# Patient Record
Sex: Female | Born: 1980 | Race: White | Hispanic: No | Smoking: Current every day smoker
Health system: Southern US, Community
[De-identification: ages and names within clinical notes are randomized; demographics above are authoritative.]

## PROBLEM LIST (undated history)

## (undated) DIAGNOSIS — Z1371 Encounter for nonprocreative screening for genetic disease carrier status: Secondary | ICD-10-CM

## (undated) DIAGNOSIS — F329 Major depressive disorder, single episode, unspecified: Secondary | ICD-10-CM

## (undated) DIAGNOSIS — F32A Depression, unspecified: Secondary | ICD-10-CM

## (undated) DIAGNOSIS — Z803 Family history of malignant neoplasm of breast: Secondary | ICD-10-CM

## (undated) DIAGNOSIS — F319 Bipolar disorder, unspecified: Secondary | ICD-10-CM

## (undated) DIAGNOSIS — Z8481 Family history of carrier of genetic disease: Secondary | ICD-10-CM

## (undated) DIAGNOSIS — J45909 Unspecified asthma, uncomplicated: Secondary | ICD-10-CM

## (undated) DIAGNOSIS — F419 Anxiety disorder, unspecified: Secondary | ICD-10-CM

## (undated) HISTORY — PX: TONSILLECTOMY: SUR1361

## (undated) HISTORY — PX: CHOLECYSTECTOMY: SHX55

## (undated) HISTORY — DX: Anxiety disorder, unspecified: F41.9

## (undated) HISTORY — PX: ADENOIDECTOMY: SUR15

## (undated) HISTORY — DX: Family history of malignant neoplasm of breast: Z80.3

## (undated) HISTORY — PX: IUD REMOVAL: SHX5392

## (undated) HISTORY — DX: Family history of carrier of genetic disease: Z84.81

## (undated) HISTORY — DX: Bipolar disorder, unspecified: F31.9

## (undated) HISTORY — DX: Depression, unspecified: F32.A

## (undated) HISTORY — DX: Major depressive disorder, single episode, unspecified: F32.9

## (undated) HISTORY — PX: HERNIA REPAIR: SHX51

---
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
## 2011-02-19 ENCOUNTER — Observation Stay: Payer: Self-pay | Admitting: Internal Medicine

## 2011-02-20 ENCOUNTER — Inpatient Hospital Stay: Payer: Self-pay | Admitting: Psychiatry

## 2011-08-14 ENCOUNTER — Emergency Department: Payer: Self-pay | Admitting: Emergency Medicine

## 2011-12-18 ENCOUNTER — Emergency Department (HOSPITAL_COMMUNITY)
Admission: EM | Admit: 2011-12-18 | Discharge: 2011-12-18 | Disposition: A | Discharge status: Home / Self Care | Payer: Medicaid Other | Attending: Emergency Medicine | Admitting: Emergency Medicine

## 2011-12-18 ENCOUNTER — Encounter (HOSPITAL_COMMUNITY): Payer: Self-pay | Admitting: Emergency Medicine

## 2011-12-18 DIAGNOSIS — J45909 Unspecified asthma, uncomplicated: Secondary | ICD-10-CM | POA: Insufficient documentation

## 2011-12-18 DIAGNOSIS — K429 Umbilical hernia without obstruction or gangrene: Secondary | ICD-10-CM | POA: Insufficient documentation

## 2011-12-18 DIAGNOSIS — F17200 Nicotine dependence, unspecified, uncomplicated: Secondary | ICD-10-CM | POA: Insufficient documentation

## 2011-12-18 HISTORY — DX: Unspecified asthma, uncomplicated: J45.909

## 2011-12-18 LAB — URINALYSIS, ROUTINE W REFLEX MICROSCOPIC
Bilirubin Urine: NEGATIVE
Glucose, UA: NEGATIVE mg/dL
Ketones, ur: NEGATIVE mg/dL
Protein, ur: NEGATIVE mg/dL

## 2011-12-18 LAB — CBC WITH DIFFERENTIAL/PLATELET
Basophils Absolute: 0 10*3/uL (ref 0.0–0.1)
Eosinophils Relative: 3 % (ref 0–5)
HCT: 39.2 % (ref 36.0–46.0)
Lymphocytes Relative: 39 % (ref 12–46)
MCV: 87.7 fL (ref 78.0–100.0)
Monocytes Absolute: 0.7 10*3/uL (ref 0.1–1.0)
RDW: 13.5 % (ref 11.5–15.5)
WBC: 10 10*3/uL (ref 4.0–10.5)

## 2011-12-18 LAB — BASIC METABOLIC PANEL
BUN: 10 mg/dL (ref 6–23)
CO2: 29 mEq/L (ref 19–32)
Calcium: 9.2 mg/dL (ref 8.4–10.5)
Creatinine, Ser: 0.69 mg/dL (ref 0.50–1.10)
Glucose, Bld: 86 mg/dL (ref 70–99)

## 2011-12-18 MED ORDER — ONDANSETRON HCL 4 MG/2ML IJ SOLN
4.0000 mg | Freq: Once | INTRAMUSCULAR | Status: AC
  Administered 2011-12-18: 4 mg via INTRAVENOUS
  Filled 2011-12-18: qty 2

## 2011-12-18 MED ORDER — HYDROMORPHONE HCL PF 1 MG/ML IJ SOLN
1.0000 mg | Freq: Once | INTRAMUSCULAR | Status: AC
  Administered 2011-12-18: 1 mg via INTRAVENOUS
  Filled 2011-12-18: qty 1

## 2011-12-18 NOTE — ED Notes (Signed)
The patient is AOx4 and comfortable with her discharge instructions.  The patient's ride home is present. 

## 2011-12-18 NOTE — ED Provider Notes (Signed)
History     CSN: [id number]  Arrival date & time 12/18/11  1545   First MD Initiated Contact with Patient 12/18/11 2114      Chief Complaint  Patient presents with  . Abdominal Pain    (Consider location/radiation/quality/duration/timing/severity/associated sxs/prior treatment) HPI Pt reports several days of pain in her umbilical region gradually worsening and associated with a tender lump some occasional vomiting, no change in bowels and no fever. She has a history of umbilical hernia repaired with a stitch during removal of intraabdominal IUD. She has been coughing lately due to a bronchitis.    Past Medical History  Diagnosis Date  . Asthma     Past Surgical History  Procedure Date  . Hernia repair   . Iud removal   . Cholecystectomy   . Tonsillectomy   . Adenoidectomy     No family history on file.  History  Substance Use Topics  . Smoking status: Current Every Day Smoker  . Smokeless tobacco: Not on file  . Alcohol Use: No    OB History    Grav Para Term Preterm Abortions TAB SAB Ect Mult Living                  Review of Systems All other systems reviewed and are negative except as noted in HPI.   Allergies  Aspirin; Lodine; and Toradol  Home Medications   Current Outpatient Rx  Name Route Sig Dispense Refill  . ALBUTEROL SULFATE HFA 108 (90 BASE) MCG/ACT IN AERS Inhalation Inhale 2 puffs into the lungs every 6 (six) hours as needed. For shortness of breath    . DOXYCYCLINE HYCLATE 100 MG PO TABS Oral Take 100 mg by mouth 2 (two) times daily. Take for 10 days starting 12/13/11    . HYDROCODONE-HOMATROPINE 5-1.5 MG/5ML PO SYRP Oral Take 5 mLs by mouth every 6 (six) hours as needed. For cough      BP 102/63  Pulse 77  Temp 98.3 F (36.8 C) (Oral)  Resp 16  Ht 5' (1.524 m)  Wt 155 lb (70.308 kg)  BMI 30.27 kg/m2  SpO2 100%  LMP 12/18/2011  Physical Exam  Nursing note and vitals reviewed. Constitutional: She is oriented to person, place,  and time. She appears well-developed and well-nourished.  HENT:  Head: Normocephalic and atraumatic.  Eyes: EOM are normal. Pupils are equal, round, and reactive to light.  Neck: Normal range of motion. Neck supple.  Cardiovascular: Normal rate, normal heart sounds and intact distal pulses.   Pulmonary/Chest: Effort normal and breath sounds normal.  Abdominal: Bowel sounds are normal. She exhibits mass (umbilical herna). She exhibits no distension. There is tenderness (moderate tender umbilical hernia). There is no rebound and no guarding.  Musculoskeletal: Normal range of motion. She exhibits no edema and no tenderness.  Neurological: She is alert and oriented to person, place, and time. She has normal strength. No cranial nerve deficit or sensory deficit.  Skin: Skin is warm and dry. No rash noted.  Psychiatric: She has a normal mood and affect.    ED Course  Procedures (including critical care time)  Labs Reviewed  URINALYSIS, ROUTINE W REFLEX MICROSCOPIC - Abnormal; Notable for the following:    Hgb urine dipstick SMALL (*)     All other components within normal limits  CBC WITH DIFFERENTIAL  URINE MICROSCOPIC-ADD ON  BASIC METABOLIC PANEL  PREGNANCY, URINE   No results found.   No diagnosis found.    MDM  Pt  given pain medications, placed in trendelenburg and hernia was reduced. Pt tolerated well. Advised to hold pressure on the area for coughing or sneezing. Awaiting labs but anticipate discharge with Gen Surg followup.         Charles B. Bernette Mayers, MD 12/19/11 [phone]

## 2011-12-18 NOTE — ED Notes (Signed)
Pt c/o abdominal pain at umbilical area. Pt reports history of hernia and pain feels same. Pt c/o nausea and vomiting yesterday.

## 2012-11-16 ENCOUNTER — Emergency Department: Payer: Self-pay | Admitting: Emergency Medicine

## 2012-11-16 LAB — CBC WITH DIFFERENTIAL/PLATELET
Eosinophil #: 0.1 10*3/uL (ref 0.0–0.7)
HCT: 39.5 % (ref 35.0–47.0)
HGB: 13.7 g/dL (ref 12.0–16.0)
Lymphocyte #: 2.5 10*3/uL (ref 1.0–3.6)
Lymphocyte %: 23.9 %
MCH: 30.2 pg (ref 26.0–34.0)
MCHC: 34.5 g/dL (ref 32.0–36.0)
MCV: 87 fL (ref 80–100)
Monocyte %: 4.9 %
Neutrophil #: 7.3 10*3/uL — ABNORMAL HIGH (ref 1.4–6.5)
Neutrophil %: 69.7 %
RBC: 4.52 10*6/uL (ref 3.80–5.20)
RDW: 13.4 % (ref 11.5–14.5)
WBC: 10.5 10*3/uL (ref 3.6–11.0)

## 2012-11-16 LAB — BASIC METABOLIC PANEL
BUN: 9 mg/dL (ref 7–18)
Calcium, Total: 9.2 mg/dL (ref 8.5–10.1)
Chloride: 106 mmol/L (ref 98–107)
Co2: 26 mmol/L (ref 21–32)
Creatinine: 0.8 mg/dL (ref 0.60–1.30)
EGFR (Non-African Amer.): >60
Osmolality: 276 (ref 275–301)
Sodium: 139 mmol/L (ref 136–145)

## 2013-01-16 ENCOUNTER — Ambulatory Visit: Payer: Self-pay | Admitting: Obstetrics and Gynecology

## 2013-06-21 ENCOUNTER — Encounter: Payer: Self-pay | Admitting: Anesthesiology

## 2013-07-20 ENCOUNTER — Encounter: Payer: Self-pay | Admitting: Anesthesiology

## 2013-09-03 ENCOUNTER — Ambulatory Visit: Payer: Self-pay | Admitting: Specialist

## 2015-09-11 ENCOUNTER — Encounter: Payer: Self-pay | Admitting: *Deleted

## 2017-01-16 ENCOUNTER — Other Ambulatory Visit: Payer: Self-pay | Admitting: Advanced Practice Midwife

## 2017-01-31 ENCOUNTER — Telehealth: Payer: Self-pay

## 2017-01-31 MED ORDER — NORETHINDRONE ACETATE 5 MG PO TABS: 5.0000 mg | ORAL_TABLET | Freq: Every day | ORAL | 0 refills | Status: DC

## 2017-01-31 NOTE — Telephone Encounter (Signed)
Pt called for refill of norethindrone, has appt Fri.  Pt aware refill eRx'd.

## 2017-02-02 NOTE — Telephone Encounter (Signed)
Pt called today stating that rx was sent in incorrectly. Her family planning medicaid does not cover this med and she has to pay out of pocket and directions needed to be take 1/2 tablet (2.5 mg) daily. Called [facility] in [place] to update rx.

## 2017-02-04 ENCOUNTER — Ambulatory Visit: Payer: Self-pay | Admitting: Advanced Practice Midwife

## 2017-02-17 ENCOUNTER — Encounter: Payer: Self-pay | Admitting: Advanced Practice Midwife

## 2017-02-17 ENCOUNTER — Ambulatory Visit (INDEPENDENT_AMBULATORY_CARE_PROVIDER_SITE_OTHER): Payer: Medicaid Other | Admitting: Advanced Practice Midwife

## 2017-02-17 VITALS — BP 124/78 | Ht 64.0 in | Wt 242.0 lb

## 2017-02-17 DIAGNOSIS — J302 Other seasonal allergic rhinitis: Secondary | ICD-10-CM | POA: Insufficient documentation

## 2017-02-17 DIAGNOSIS — Z124 Encounter for screening for malignant neoplasm of cervix: Secondary | ICD-10-CM | POA: Diagnosis not present

## 2017-02-17 DIAGNOSIS — F329 Major depressive disorder, single episode, unspecified: Secondary | ICD-10-CM | POA: Insufficient documentation

## 2017-02-17 DIAGNOSIS — F319 Bipolar disorder, unspecified: Secondary | ICD-10-CM | POA: Insufficient documentation

## 2017-02-17 DIAGNOSIS — Z309 Encounter for contraceptive management, unspecified: Secondary | ICD-10-CM

## 2017-02-17 DIAGNOSIS — G43909 Migraine, unspecified, not intractable, without status migrainosus: Secondary | ICD-10-CM | POA: Insufficient documentation

## 2017-02-17 DIAGNOSIS — Z720 Tobacco use: Secondary | ICD-10-CM | POA: Insufficient documentation

## 2017-02-17 DIAGNOSIS — N939 Abnormal uterine and vaginal bleeding, unspecified: Secondary | ICD-10-CM | POA: Diagnosis not present

## 2017-02-17 DIAGNOSIS — Z01419 Encounter for gynecological examination (general) (routine) without abnormal findings: Secondary | ICD-10-CM

## 2017-02-17 MED ORDER — NORETHINDRONE ACETATE 5 MG PO TABS: 5.0000 mg | ORAL_TABLET | Freq: Every day | ORAL | 12 refills | Status: DC

## 2017-02-17 NOTE — Progress Notes (Addendum)
Patient ID: CARLIS BURNSWORTH, female   DOB: Oct 27, 1980, 36 y.o.   MRN: [id number]     Gynecology Annual Exam  PCP: System, Provider Not In  Chief Complaint:  Chief Complaint  Patient presents with  . Annual Exam    History of Present Illness: Patient is a 36 y.o. [id number] presents for annual exam. The patient has complaint today of itchy spot on left breast. She has a small hemangioma at 3:00 that she has noticed for 10 years. The itching has been there for 1 year. There are no other skin changes on the breast. We also had a discussion of smoking cessation and healthy lifestyle. She uses Norethindrone 5 mg to control her heavy, painful periods.   LMP: Patient's last menstrual period was 10/17/2016. She ran out of pills in July and had breakthrough bleeding then but has not had any bleeding since then.  Postcoital Bleeding: no Dysmenorrhea: not applicable  The patient is sexually active. She currently uses vasectomy for contraception. She denies dyspareunia.  The patient does perform self breast exams.  There is notable family history of breast or ovarian cancer in her family. Her paternal aunt had breast cancer at less than 15 years old. She tested positive for BRCA gene. The patient declines genetic screening.   The patient wears seatbelts: yes.   The patient has regular exercise: no.  She does drink sugar sweetened beverages. She admits to being ready to quit smoking and she has gone from 2 PPD to less than a PPD. She knows that smoking is a risk factor for cancer and other illnesses. She has a 25 year smoking history.  The patient denies current symptoms of depression.  She takes Seroquel to control her mood symptoms.  Review of Systems: Review of Systems  Constitutional: Negative.   HENT: Negative.   Eyes: Negative.   Respiratory: Negative.   Cardiovascular: Negative.   Gastrointestinal: Negative.   Genitourinary: Negative.   Musculoskeletal: Negative.   Skin: Positive for  itching.       On left breast at 3:00  Neurological: Negative.   Endo/Heme/Allergies: Negative.   Psychiatric/Behavioral: Negative.     Past Medical History:  Past Medical History:  Diagnosis Date  . Anxiety   . Asthma   . Bipolar 1 disorder ([place])   . Depressive disorder     Past Surgical History:  Past Surgical History:  Procedure Laterality Date  . ADENOIDECTOMY    . CHOLECYSTECTOMY    . HERNIA REPAIR    . IUD REMOVAL    . TONSILLECTOMY      Gynecologic History:  Patient's last menstrual period was 10/17/2016. Contraception: vasectomy Last Pap: 2 years ago Results were: no abnormalities   Obstetric History: [id number]  Family History:  Family History  Problem Relation Age of Onset  . Breast cancer Maternal Grandmother >50        Breast cancer                                           Paternal Aunt                       <50  Social History:  Social History   Socioeconomic History  . Marital status: Married    Spouse name: Not on file  . Number of children: Not on file  . Years of education:  Not on file  . Highest education level: Not on file  Social Needs  . Financial resource strain: Not on file  . Food insecurity - worry: Not on file  . Food insecurity - inability: Not on file  . Transportation needs - medical: Not on file  . Transportation needs - non-medical: Not on file  Occupational History  . Not on file  Tobacco Use  . Smoking status: Current Every Day Smoker  . Smokeless tobacco: Never Used  Substance and Sexual Activity  . Alcohol use: No  . Drug use: No  . Sexual activity: Yes    Birth control/protection: Pill  Other Topics Concern  . Not on file  Social History Narrative  . Not on file    Allergies:  Allergies  Allergen Reactions  . Morphine Anaphylaxis  . Aspirin     Unknown  . Lodine [Etodolac]     Unknown  . Toradol [Ketorolac Tromethamine]     Unknown    Medications: Prior to Admission medications   Medication Sig Start  Date End Date Taking? Authorizing Provider  esomeprazole (NEXIUM) 20 MG packet Take 20 mg by mouth daily before breakfast.   Yes [provider]  loratadine (CLARITIN) 10 MG tablet TK 1 T PO QD 01/20/16  Yes [provider]  norethindrone (AYGESTIN) 5 MG tablet Take 1 tablet (5 mg total) daily by mouth. 01/31/17  Yes Rod Can, CNM  QUEtiapine (SEROQUEL XR) 400 MG 24 hr tablet TK 1 T PO QPM WF 01/22/16  Yes [provider]  albuterol (PROVENTIL HFA;VENTOLIN HFA) 108 (90 BASE) MCG/ACT inhaler Inhale 2 puffs into the lungs every 6 (six) hours as needed. For shortness of breath    [provider]    Physical Exam Vitals: Blood pressure 124/78, height _0  (1.626 m), weight 242 lb (109.8 kg), last menstrual period 10/17/2016.  General: NAD HEENT: normocephalic, anicteric Thyroid: no enlargement, no palpable nodules Pulmonary: No increased work of breathing, CTAB Cardiovascular: RRR, distal pulses 2+ Breast: Breast symmetrical, no tenderness, no palpable nodules or masses, no skin or nipple retraction present, no nipple discharge.  No axillary or supraclavicular lymphadenopathy. Small hemangioma at 3:00. Abdomen: NABS, soft, non-tender, non-distended.  Umbilicus without lesions.  No hepatomegaly, splenomegaly or masses palpable. No evidence of hernia  Genitourinary:  External: Normal external female genitalia.  Normal urethral meatus, normal  Bartholin's and Skene's glands.    Vagina: Normal vaginal mucosa, no evidence of prolapse.    Cervix: Grossly normal in appearance, no bleeding, no CMT  Uterus: Non-enlarged, mobile, normal contour.    Adnexa: ovaries non-enlarged, no adnexal masses  Rectal: deferred  Lymphatic: no evidence of inguinal lymphadenopathy Extremities: no edema, erythema, or tenderness Neurologic: Grossly intact Psychiatric: mood appropriate, affect full   Assessment: 36 y.o. [id number] routine annual exam  Plan: Problem List Items  Addressed This Visit    None    Visit Diagnoses    Well woman exam with routine gynecological exam    -  Primary   Relevant Orders   IGP, Aptima HPV   Cervical cancer screening       Relevant Orders   IGP, Aptima HPV   Abnormal uterine bleeding       Relevant Medications   norethindrone (AYGESTIN) 5 MG tablet      1) STI screening was offered and declined  2) ASCCP guidelines and rational discussed.  Patient opts for yearly screening interval  3) Contraception - Vasectomy  4) Routine healthcare maintenance  including cholesterol, diabetes screening discussed Declines  5) Follow up 1 year for routine annual exam  Rod Can, CNM

## 2017-02-17 NOTE — Patient Instructions (Addendum)
Health Maintenance, Female Adopting a healthy lifestyle and getting preventive care can go a long way to promote health and wellness. Talk with your health care provider about what schedule of regular examinations is right for you. This is a good chance for you to check in with your provider about disease prevention and staying healthy. In between checkups, there are plenty of things you can do on your own. Experts have done a lot of research about which lifestyle changes and preventive measures are most likely to keep you healthy. Ask your health care provider for more information. Weight and diet Eat a healthy diet  Be sure to include plenty of vegetables, fruits, low-fat dairy products, and lean protein.  Do not eat a lot of foods high in solid fats, added sugars, or salt.  Get regular exercise. This is one of the most important things you can do for your health. ? Most adults should exercise for at least 150 minutes each week. The exercise should increase your heart rate and make you sweat (moderate-intensity exercise). ? Most adults should also do strengthening exercises at least twice a week. This is in addition to the moderate-intensity exercise.  Maintain a healthy weight  Body mass index (BMI) is a measurement that can be used to identify possible weight problems. It estimates body fat based on height and weight. Your health care provider can help determine your BMI and help you achieve or maintain a healthy weight.  For females 69 years of age and older: ? A BMI below 18.5 is considered underweight. ? A BMI of 18.5 to 24.9 is normal. ? A BMI of 25 to 29.9 is considered overweight. ? A BMI of 30 and above is considered obese.  Watch levels of cholesterol and blood lipids  You should start having your blood tested for lipids and cholesterol at 37 years of age, then have this test every 5 years.  You may need to have your cholesterol levels checked more often if: ? Your lipid or  cholesterol levels are high. ? You are older than 37 years of age. ? You are at high risk for heart disease.  Cancer screening Lung Cancer  Lung cancer screening is recommended for adults 70-27 years old who are at high risk for lung cancer because of a history of smoking.  A yearly low-dose CT scan of the lungs is recommended for people who: ? Currently smoke. ? Have quit within the past 15 years. ? Have at least a 30-pack-year history of smoking. A pack year is smoking an average of one pack of cigarettes a day for 1 year.  Yearly screening should continue until it has been 15 years since you quit.  Yearly screening should stop if you develop a health problem that would prevent you from having lung cancer treatment.  Breast Cancer  Practice breast self-awareness. This means understanding how your breasts normally appear and feel.  It also means doing regular breast self-exams. Let your health care provider know about any changes, no matter how small.  If you are in your 20s or 30s, you should have a clinical breast exam (CBE) by a health care provider every 1-3 years as part of a regular health exam.  If you are 68 or older, have a CBE every year. Also consider having a breast X-ray (mammogram) every year.  If you have a family history of breast cancer, talk to your health care provider about genetic screening.  If you are at high risk  for breast cancer, talk to your health care provider about having an MRI and a mammogram every year.  Breast cancer gene (BRCA) assessment is recommended for women who have family members with BRCA-related cancers. BRCA-related cancers include: ? Breast. ? Ovarian. ? Tubal. ? Peritoneal cancers.  Results of the assessment will determine the need for genetic counseling and BRCA1 and BRCA2 testing.  Cervical Cancer Your health care provider may recommend that you be screened regularly for cancer of the pelvic organs (ovaries, uterus, and  vagina). This screening involves a pelvic examination, including checking for microscopic changes to the surface of your cervix (Pap test). You may be encouraged to have this screening done every 3 years, beginning at age 22.  For women ages 56-65, health care providers may recommend pelvic exams and Pap testing every 3 years, or they may recommend the Pap and pelvic exam, combined with testing for human papilloma virus (HPV), every 5 years. Some types of HPV increase your risk of cervical cancer. Testing for HPV may also be done on women of any age with unclear Pap test results.  Other health care providers may not recommend any screening for nonpregnant women who are considered low risk for pelvic cancer and who do not have symptoms. Ask your health care provider if a screening pelvic exam is right for you.  If you have had past treatment for cervical cancer or a condition that could lead to cancer, you need Pap tests and screening for cancer for at least 20 years after your treatment. If Pap tests have been discontinued, your risk factors (such as having a new sexual partner) need to be reassessed to determine if screening should resume. Some women have medical problems that increase the chance of getting cervical cancer. In these cases, your health care provider may recommend more frequent screening and Pap tests.  Colorectal Cancer  This type of cancer can be detected and often prevented.  Routine colorectal cancer screening usually begins at 37 years of age and continues through 37 years of age.  Your health care provider may recommend screening at an earlier age if you have risk factors for colon cancer.  Your health care provider may also recommend using home test kits to check for hidden blood in the stool.  A small camera at the end of a tube can be used to examine your colon directly (sigmoidoscopy or colonoscopy). This is done to check for the earliest forms of colorectal  cancer.  Routine screening usually begins at age 33.  Direct examination of the colon should be repeated every 5-10 years through 37 years of age. However, you may need to be screened more often if early forms of precancerous polyps or small growths are found.  Skin Cancer  Check your skin from head to toe regularly.  Tell your health care provider about any new moles or changes in moles, especially if there is a change in a mole's shape or color.  Also tell your health care provider if you have a mole that is larger than the size of a pencil eraser.  Always use sunscreen. Apply sunscreen liberally and repeatedly throughout the day.  Protect yourself by wearing long sleeves, pants, a wide-brimmed hat, and sunglasses whenever you are outside.  Heart disease, diabetes, and high blood pressure  High blood pressure causes heart disease and increases the risk of stroke. High blood pressure is more likely to develop in: ? People who have blood pressure in the high end of  the normal range (130-139/85-89 mm Hg). ? People who are overweight or obese. ? People who are African American.  If you are 21-29 years of age, have your blood pressure checked every 3-5 years. If you are 3 years of age or older, have your blood pressure checked every year. You should have your blood pressure measured twice-once when you are at a hospital or clinic, and once when you are not at a hospital or clinic. Record the average of the two measurements. To check your blood pressure when you are not at a hospital or clinic, you can use: ? An automated blood pressure machine at a pharmacy. ? A home blood pressure monitor.  If you are between 17 years and 37 years old, ask your health care provider if you should take aspirin to prevent strokes.  Have regular diabetes screenings. This involves taking a blood sample to check your fasting blood sugar level. ? If you are at a normal weight and have a low risk for diabetes,  have this test once every three years after 37 years of age. ? If you are overweight and have a high risk for diabetes, consider being tested at a younger age or more often. Preventing infection Hepatitis B  If you have a higher risk for hepatitis B, you should be screened for this virus. You are considered at high risk for hepatitis B if: ? You were born in a country where hepatitis B is common. Ask your health care provider which countries are considered high risk. ? Your parents were born in a high-risk country, and you have not been immunized against hepatitis B (hepatitis B vaccine). ? You have HIV or AIDS. ? You use needles to inject street drugs. ? You live with someone who has hepatitis B. ? You have had sex with someone who has hepatitis B. ? You get hemodialysis treatment. ? You take certain medicines for conditions, including cancer, organ transplantation, and autoimmune conditions.  Hepatitis C  Blood testing is recommended for: ? Everyone born from [birth year] through 1966. ? Anyone with known risk factors for hepatitis C.  Sexually transmitted infections (STIs)  You should be screened for sexually transmitted infections (STIs) including gonorrhea and chlamydia if: ? You are sexually active and are younger than 37 years of age. ? You are older than 36 years of age and your health care provider tells you that you are at risk for this type of infection. ? Your sexual activity has changed since you were last screened and you are at an increased risk for chlamydia or gonorrhea. Ask your health care provider if you are at risk.  If you do not have HIV, but are at risk, it may be recommended that you take a prescription medicine daily to prevent HIV infection. This is called pre-exposure prophylaxis (PrEP). You are considered at risk if: ? You are sexually active and do not regularly use condoms or know the HIV status of your partner(s). ? You take drugs by injection. ? You are  sexually active with a partner who has HIV.  Talk with your health care provider about whether you are at high risk of being infected with HIV. If you choose to begin PrEP, you should first be tested for HIV. You should then be tested every 3 months for as long as you are taking PrEP. Pregnancy  If you are premenopausal and you may become pregnant, ask your health care provider about preconception counseling.  If you may become  pregnant, take 400 to 800 micrograms (mcg) of folic acid every day.  If you want to prevent pregnancy, talk to your health care provider about birth control (contraception). Osteoporosis and menopause  Osteoporosis is a disease in which the bones lose minerals and strength with aging. This can result in serious bone fractures. Your risk for osteoporosis can be identified using a bone density scan.  If you are 52 years of age or older, or if you are at risk for osteoporosis and fractures, ask your health care provider if you should be screened.  Ask your health care provider whether you should take a calcium or vitamin D supplement to lower your risk for osteoporosis.  Menopause may have certain physical symptoms and risks.  Hormone replacement therapy may reduce some of these symptoms and risks. Talk to your health care provider about whether hormone replacement therapy is right for you. Follow these instructions at home:  Schedule regular health, dental, and eye exams.  Stay current with your immunizations.  Do not use any tobacco products including cigarettes, chewing tobacco, or electronic cigarettes.  If you are pregnant, do not drink alcohol.  If you are breastfeeding, limit how much and how often you drink alcohol.  Limit alcohol intake to no more than 1 drink per day for nonpregnant women. One drink equals 12 ounces of beer, 5 ounces of wine, or 1 ounces of hard liquor.  Do not use street drugs.  Do not share needles.  Ask your health care  provider for help if you need support or information about quitting drugs.  Tell your health care provider if you often feel depressed.  Tell your health care provider if you have ever been abused or do not feel safe at home. This information is not intended to replace advice given to you by your health care provider. Make sure you discuss any questions you have with your health care provider. Document Released: 09/21/2010 Document Revised: 08/14/2015 Document Reviewed: 12/10/2014 Elsevier Interactive Patient Education  2018 [employer].     Why follow it? Research shows. . Those who follow the Mediterranean diet have a reduced risk of heart disease  . The diet is associated with a reduced incidence of Parkinson's and Alzheimer's diseases . People following the diet may have longer life expectancies and lower rates of chronic diseases  . The Dietary Guidelines for Americans recommends the Mediterranean diet as an eating plan to promote health and prevent disease  What Is the Mediterranean Diet?  . Healthy eating plan based on typical foods and recipes of Mediterranean-style cooking . The diet is primarily a plant based diet; these foods should make up a majority of meals   Starches - Plant based foods should make up a majority of meals - They are an important sources of vitamins, minerals, energy, antioxidants, and fiber - Choose whole grains, foods high in fiber and minimally processed items  - Typical grain sources include wheat, oats, barley, corn, brown rice, bulgar, farro, millet, polenta, couscous  - Various types of beans include chickpeas, lentils, fava beans, black beans, white beans   Fruits  Veggies - Large quantities of antioxidant rich fruits & veggies; 6 or more servings  - Vegetables can be eaten raw or lightly drizzled with oil and cooked  - Vegetables common to the traditional Mediterranean Diet include: artichokes, arugula, beets, broccoli, brussel sprouts, cabbage,  carrots, celery, collard greens, cucumbers, eggplant, kale, leeks, lemons, lettuce, mushrooms, okra, onions, peas, peppers, potatoes, pumpkin, radishes, rutabaga,  shallots, spinach, sweet potatoes, turnips, zucchini - Fruits common to the Mediterranean Diet include: apples, apricots, avocados, cherries, clementines, dates, figs, grapefruits, grapes, melons, nectarines, oranges, peaches, pears, pomegranates, strawberries, tangerines  Fats - Replace butter and margarine with healthy oils, such as olive oil, canola oil, and tahini  - Limit nuts to no more than a handful a day  - Nuts include walnuts, almonds, pecans, pistachios, pine nuts  - Limit or avoid candied, honey roasted or heavily salted nuts - Olives are central to the [employer] - can be eaten whole or used in a variety of dishes   Meats Protein - Limiting red meat: no more than a few times a month - When eating red meat: choose lean cuts and keep the portion to the size of deck of cards - Eggs: approx. 0 to 4 times a week  - Fish and lean poultry: at least 2 a week  - Healthy protein sources include, chicken, [place], lean beef, lamb - Increase intake of seafood such as tuna, salmon, trout, mackerel, shrimp, scallops - Avoid or limit high fat processed meats such as sausage and bacon  Dairy - Include moderate amounts of low fat dairy products  - Focus on healthy dairy such as fat free yogurt, skim milk, low or reduced fat cheese - Limit dairy products higher in fat such as whole or 2% milk, cheese, ice cream  Alcohol - Moderate amounts of red wine is ok  - No more than 5 oz daily for women (all ages) and men older than age 18  - No more than 10 oz of wine daily for men younger than 98  Other - Limit sweets and other desserts  - Use herbs and spices instead of salt to flavor foods  - Herbs and spices common to the traditional Mediterranean Diet include: basil, bay leaves, chives, cloves, cumin, fennel, garlic, lavender, marjoram,  mint, oregano, parsley, pepper, rosemary, sage, savory, sumac, tarragon, thyme   It's not just a diet, it's a lifestyle:  . The Mediterranean diet includes lifestyle factors typical of those in the region  . Foods, drinks and meals are best eaten with others and savored . Daily physical activity is important for overall good health . This could be strenuous exercise like running and aerobics . This could also be more leisurely activities such as walking, housework, yard-work, or taking the stairs . Moderation is the key; a balanced and healthy diet accommodates most foods and drinks . Consider portion sizes and frequency of consumption of certain foods   Meal Ideas & Options:  . Breakfast:  o Whole wheat toast or whole wheat English muffins with peanut butter & hard boiled egg o Steel cut oats topped with apples & cinnamon and skim milk  o Fresh fruit: banana, strawberries, melon, berries, peaches  o Smoothies: strawberries, bananas, greek yogurt, peanut butter o Low fat greek yogurt with blueberries and granola  o Egg white omelet with spinach and mushrooms o Breakfast couscous: whole wheat couscous, apricots, skim milk, cranberries  . Sandwiches:  o Hummus and grilled vegetables (peppers, zucchini, squash) on whole wheat bread   o Grilled chicken on whole wheat pita with lettuce, tomatoes, cucumbers or tzatziki  o Tuna salad on whole wheat bread: tuna salad made with greek yogurt, olives, red peppers, capers, green onions o Garlic rosemary lamb pita: lamb sauted with garlic, rosemary, salt & pepper; add lettuce, cucumber, greek yogurt to pita - flavor with lemon juice and black pepper  .  Seafood:  o Mediterranean grilled salmon, seasoned with garlic, basil, parsley, lemon juice and black pepper o Shrimp, lemon, and spinach whole-grain pasta salad made with low fat greek yogurt  o Seared scallops with lemon orzo  o Seared tuna steaks seasoned salt, pepper, coriander topped with tomato  mixture of olives, tomatoes, olive oil, minced garlic, parsley, green onions and cappers  . Meats:  o Herbed greek chicken salad with kalamata olives, cucumber, feta  o Red bell peppers stuffed with spinach, bulgur, lean ground beef (or lentils) & topped with feta   o Kebabs: skewers of chicken, tomatoes, onions, zucchini, squash  o Turkey burgers: made with red onions, mint, dill, lemon juice, feta cheese topped with roasted red peppers . Vegetarian o Cucumber salad: cucumbers, artichoke hearts, celery, red onion, feta cheese, tossed in olive oil & lemon juice  o Hummus and whole grain pita points with a greek salad (lettuce, tomato, feta, olives, cucumbers, red onion) o Lentil soup with celery, carrots made with vegetable broth, garlic, salt and pepper  o Tabouli salad: parsley, bulgur, mint, scallions, cucumbers, tomato, radishes, lemon juice, olive oil, salt and pepper.      American Heart Association (AHA) Exercise Recommendation  Being physically active is important to prevent heart disease and stroke, the nation's No. 1and No. 5killers. To improve overall cardiovascular health, we suggest at least 150 minutes per week of moderate exercise or 75 minutes per week of vigorous exercise (or a combination of moderate and vigorous activity). Thirty minutes a day, five times a week is an easy goal to remember. You will also experience benefits even if you divide your time into two or three segments of 10 to 15 minutes per day.  For people who would benefit from lowering their blood pressure or cholesterol, we recommend 40 minutes of aerobic exercise of moderate to vigorous intensity three to four times a week to lower the risk for heart attack and stroke.  Physical activity is anything that makes you move your body and burn calories.  This includes things like climbing stairs or playing sports. Aerobic exercises benefit your heart, and include walking, jogging, swimming or biking. Strength and  stretching exercises are best for overall stamina and flexibility.  The simplest, positive change you can make to effectively improve your heart health is to start walking. It's enjoyable, free, easy, social and great exercise. A walking program is flexible and boasts high success rates because people can stick with it. It's easy for walking to become a regular and satisfying part of life.   For Overall Cardiovascular Health:  At least 30 minutes of moderate-intensity aerobic activity at least 5 days per week for a total of 150  OR   At least 25 minutes of vigorous aerobic activity at least 3 days per week for a total of 75 minutes; or a combination of moderate- and vigorous-intensity aerobic activity  AND   Moderate- to high-intensity muscle-strengthening activity at least 2 days per week for additional health benefits.  For Lowering Blood Pressure and Cholesterol  An average 40 minutes of moderate- to vigorous-intensity aerobic activity 3 or 4 times per week  What if I can't make it to the time goal? Something is always better than nothing! And everyone has to start somewhere. Even if you've been sedentary for years, today is the day you can begin to make healthy changes in your life. If you don't think you'll make it for 30 or 40 minutes, set a reachable goal for   today. You can work up toward your overall goal by increasing your time as you get stronger. Don't let all-or-nothing thinking rob you of doing what you can every day.  Source:http://www.heart.org    Coping with Quitting Smoking Quitting smoking is a physical and mental challenge. You will face cravings, withdrawal symptoms, and temptation. Before quitting, work with your health care provider to make a plan that can help you cope. Preparation can help you quit and keep you from giving in. How can I cope with cravings? Cravings usually last for 5-10 minutes. If you get through it, the craving will pass. Consider taking the  following actions to help you cope with cravings:  Keep your mouth busy: ? Chew sugar-free gum. ? Suck on hard candies or a straw. ? Brush your teeth.  Keep your hands and body busy: ? Immediately change to a different activity when you feel a craving. ? Squeeze or play with a ball. ? Do an activity or a hobby, like making bead jewelry, practicing needlepoint, or working with wood. ? Mix up your normal routine. ? Take a short exercise break. Go for a quick walk or run up and down stairs. ? Spend time in public places where smoking is not allowed.  Focus on doing something kind or helpful for someone else.  Call a friend or family member to talk during a craving.  Join a support group.  Call a quit line, such as 1-800-QUIT-NOW.  Talk with your health care provider about medicines that might help you cope with cravings and make quitting easier for you.  How can I deal with withdrawal symptoms? Your body may experience negative effects as it tries to get used to not having nicotine in the system. These effects are called withdrawal symptoms. They may include:  Feeling hungrier than normal.  Trouble concentrating.  Irritability.  Trouble sleeping.  Feeling depressed.  Restlessness and agitation.  Craving a cigarette.  To manage withdrawal symptoms:  Avoid places, people, and activities that trigger your cravings.  Remember why you want to quit.  Get plenty of sleep.  Avoid coffee and other caffeinated drinks. These may worsen some of your symptoms.  How can I handle social situations? Social situations can be difficult when you are quitting smoking, especially in the first few weeks. To manage this, you can:  Avoid parties, bars, and other social situations where people might be smoking.  Avoid alcohol.  Leave right away if you have the urge to smoke.  Explain to your family and friends that you are quitting smoking. Ask for understanding and support.  Plan  activities with friends or family where smoking is not an option.  What are some ways I can cope with stress? Wanting to smoke may cause stress, and stress can make you want to smoke. Find ways to manage your stress. Relaxation techniques can help. For example:  Breathe slowly and deeply, in through your nose and out through your mouth.  Listen to soothing, relaxing music.  Talk with a family member or friend about your stress.  Light a candle.  Soak in a bath or take a shower.  Think about a peaceful place.  What are some ways I can prevent weight gain? Be aware that many people gain weight after they quit smoking. However, not everyone does. To keep from gaining weight, have a plan in place before you quit and stick to the plan after you quit. Your plan should include:  Having healthy snacks. When   you have a craving, it may help to: ? Eat plain popcorn, crunchy carrots, celery, or other cut vegetables. ? Chew sugar-free gum.  Changing how you eat: ? Eat small portion sizes at meals. ? Eat 4-6 small meals throughout the day instead of 1-2 large meals a day. ? Be mindful when you eat. Do not watch television or do other things that might distract you as you eat.  Exercising regularly: ? Make time to exercise each day. If you do not have time for a long workout, do short bouts of exercise for 5-10 minutes several times a day. ? Do some form of strengthening exercise, like weight lifting, and some form of aerobic exercise, like running or swimming.  Drinking plenty of water or other low-calorie or no-calorie drinks. Drink 6-8 glasses of water daily, or as much as instructed by your health care provider.  Summary  Quitting smoking is a physical and mental challenge. You will face cravings, withdrawal symptoms, and temptation to smoke again. Preparation can help you as you go through these challenges.  You can cope with cravings by keeping your mouth busy (such as by chewing gum),  keeping your body and hands busy, and making calls to family, friends, or a helpline for people who want to quit smoking.  You can cope with withdrawal symptoms by avoiding places where people smoke, avoiding drinks with caffeine, and getting plenty of rest.  Ask your health care provider about the different ways to prevent weight gain, avoid stress, and handle social situations. This information is not intended to replace advice given to you by your health care provider. Make sure you discuss any questions you have with your health care provider. Document Released: 03/05/2016 Document Revised: 03/05/2016 Document Reviewed: 03/05/2016 Elsevier Interactive Patient Education  2018 Elsevier Inc.  

## 2017-02-18 ENCOUNTER — Telehealth: Payer: Self-pay | Admitting: Advanced Practice Midwife

## 2017-02-18 NOTE — Telephone Encounter (Signed)
Pt is is calling to update pharmacy. [facility] in [place], On [address]

## 2017-02-19 LAB — IGP, APTIMA HPV
HPV APTIMA: NEGATIVE
PAP Smear Comment: 0

## 2017-02-21 ENCOUNTER — Encounter: Payer: Self-pay | Admitting: Obstetrics and Gynecology

## 2017-02-22 ENCOUNTER — Telehealth: Payer: Self-pay | Admitting: Advanced Practice Midwife

## 2017-02-22 NOTE — Telephone Encounter (Signed)
Joellyn QuailsChristy Burton [facility] called to let me know she tried to contact the patient but the patient hung up on her, and when she called back the patient was very rude and told her to not call back again. I attempted to reach the patient and she hung up on me as well.

## 2017-02-22 NOTE — Telephone Encounter (Signed)
Pharm changed 

## 2017-02-24 NOTE — Telephone Encounter (Signed)
Is this the patient that I told you wanted mammogram but did not have insurance? And you were trying to hook her up with the program that helps with that? Seems strange that the patient would have that reaction since we talked about this program.

## 2017-12-29 ENCOUNTER — Emergency Department
Admission: EM | Admit: 2017-12-29 | Discharge: 2017-12-29 | Disposition: A | Discharge status: Home / Self Care | Payer: Medicaid Other | Attending: Emergency Medicine | Admitting: Emergency Medicine

## 2017-12-29 DIAGNOSIS — J45909 Unspecified asthma, uncomplicated: Secondary | ICD-10-CM | POA: Insufficient documentation

## 2017-12-29 DIAGNOSIS — F17200 Nicotine dependence, unspecified, uncomplicated: Secondary | ICD-10-CM | POA: Insufficient documentation

## 2017-12-29 DIAGNOSIS — R519 Headache, unspecified: Secondary | ICD-10-CM

## 2017-12-29 DIAGNOSIS — R51 Headache: Secondary | ICD-10-CM | POA: Diagnosis present

## 2017-12-29 DIAGNOSIS — G43909 Migraine, unspecified, not intractable, without status migrainosus: Secondary | ICD-10-CM | POA: Insufficient documentation

## 2017-12-29 LAB — COMPREHENSIVE METABOLIC PANEL
ALBUMIN: 3.2 g/dL — AB (ref 3.5–5.0)
ALT: 13 U/L (ref 0–44)
ANION GAP: 10 (ref 5–15)
AST: 19 U/L (ref 15–41)
Alkaline Phosphatase: 66 U/L (ref 38–126)
BILIRUBIN TOTAL: 0.3 mg/dL (ref 0.3–1.2)
BUN: 8 mg/dL (ref 6–20)
CO2: 25 mmol/L (ref 22–32)
Calcium: 8.9 mg/dL (ref 8.9–10.3)
Chloride: 107 mmol/L (ref 98–111)
Creatinine, Ser: 0.57 mg/dL (ref 0.44–1.00)
GFR calc Af Amer: >60 mL/min (ref >60)
Glucose, Bld: 105 mg/dL — ABNORMAL HIGH (ref 70–99)
Potassium: 3.7 mmol/L (ref 3.5–5.1)
Sodium: 142 mmol/L (ref 135–145)
TOTAL PROTEIN: 7 g/dL (ref 6.5–8.1)

## 2017-12-29 LAB — CBC WITH DIFFERENTIAL/PLATELET
Abs Immature Granulocytes: 0.03 10*3/uL (ref 0.00–0.07)
BASOS ABS: 0.1 10*3/uL (ref 0.0–0.1)
BASOS PCT: 1 %
EOS ABS: 0.3 10*3/uL (ref 0.0–0.5)
EOS PCT: 3 %
HEMATOCRIT: 36.4 % (ref 36.0–46.0)
Hemoglobin: 11.6 g/dL — ABNORMAL LOW (ref 12.0–15.0)
Immature Granulocytes: 0 %
Lymphocytes Relative: 23 %
Lymphs Abs: 2.2 10*3/uL (ref 0.7–4.0)
MCH: 28.4 pg (ref 26.0–34.0)
MCHC: 31.9 g/dL (ref 30.0–36.0)
MCV: 89.2 fL (ref 80.0–100.0)
Monocytes Absolute: 0.6 10*3/uL (ref 0.1–1.0)
Monocytes Relative: 6 %
NRBC: 0 % (ref 0.0–0.2)
Neutro Abs: 6.2 10*3/uL (ref 1.7–7.7)
Neutrophils Relative %: 67 %
PLATELETS: 319 10*3/uL (ref 150–400)
RBC: 4.08 MIL/uL (ref 3.87–5.11)
RDW: 14 % (ref 11.5–15.5)
WBC: 9.3 10*3/uL (ref 4.0–10.5)

## 2017-12-29 MED ORDER — DIPHENHYDRAMINE HCL 50 MG/ML IJ SOLN
25.0000 mg | Freq: Once | INTRAMUSCULAR | Status: AC
  Administered 2017-12-29: 25 mg via INTRAVENOUS
  Filled 2017-12-29: qty 1

## 2017-12-29 MED ORDER — TOPIRAMATE 25 MG PO TABS: 25.0000 mg | ORAL_TABLET | Freq: Every day | ORAL | 2 refills | Status: AC

## 2017-12-29 MED ORDER — TOPIRAMATE 25 MG PO TABS
25.0000 mg | ORAL_TABLET | Freq: Once | ORAL | Status: AC
  Administered 2017-12-29: 25 mg via ORAL
  Filled 2017-12-29: qty 1

## 2017-12-29 MED ORDER — METOCLOPRAMIDE HCL 5 MG/ML IJ SOLN
10.0000 mg | Freq: Once | INTRAMUSCULAR | Status: AC
  Administered 2017-12-29: 10 mg via INTRAVENOUS
  Filled 2017-12-29: qty 2

## 2017-12-29 MED ORDER — PROMETHAZINE HCL 25 MG PO TABS: 25.0000 mg | ORAL_TABLET | ORAL | 1 refills | Status: DC | PRN

## 2017-12-29 MED ORDER — BUTALBITAL-APAP-CAFFEINE 50-325-40 MG PO TABS
2.0000 | ORAL_TABLET | Freq: Once | ORAL | Status: AC
  Administered 2017-12-29: 2 via ORAL
  Filled 2017-12-29: qty 2

## 2017-12-29 MED ORDER — ACETAMINOPHEN 500 MG PO TABS
1000.0000 mg | ORAL_TABLET | Freq: Once | ORAL | Status: AC
  Administered 2017-12-29: 1000 mg via ORAL
  Filled 2017-12-29: qty 2

## 2017-12-29 MED ORDER — SODIUM CHLORIDE 0.9 % IV SOLN
Freq: Once | INTRAVENOUS | Status: AC
  Administered 2017-12-29: 08:00:00 via INTRAVENOUS

## 2017-12-29 NOTE — ED Triage Notes (Signed)
patient c/o nausea, headache, light sensitivity. Patient reports hx of migraines, reports this feels the same.

## 2017-12-29 NOTE — ED Provider Notes (Signed)
[facility] Emergency Department Provider Note       Time seen: ----------------------------------------- 7:02 AM on 12/29/2017 -----------------------------------------   I have reviewed the triage vital signs and the nursing notes.  HISTORY   Chief Complaint Headache   HPI Angela Mooney is a 37 y.o. female with a history of anxiety, bipolar disorder, depression who presents to the ED for headache behind both eyes with nausea and light sensitivity.  Patient reports a history of migraines reports this feels like same.  She has light and sound sensitivity, vomited prior to arrival.  She reports she has been off of her Topamax for the last year and a half.  Past Medical History:  Diagnosis Date  . Anxiety   . Asthma   . Bipolar 1 disorder ([place])   . Depressive disorder   . FH: BRCA gene positive    pat aunt; pt declined genetic testing 01/2017    Patient Active Problem List   Diagnosis Date Noted  . Bipolar disorder ([place]) 02/17/2017  . MDD (major depressive disorder) 02/17/2017  . Migraines 02/17/2017  . Seasonal allergies 02/17/2017  . Tobacco use 02/17/2017    Past Surgical History:  Procedure Laterality Date  . ADENOIDECTOMY    . CHOLECYSTECTOMY    . HERNIA REPAIR    . IUD REMOVAL    . TONSILLECTOMY      Allergies Morphine; Aspirin; Lodine [etodolac]; and Toradol [ketorolac tromethamine]  Social History Social History   Tobacco Use  . Smoking status: Current Every Day Smoker  . Smokeless tobacco: Never Used  Substance Use Topics  . Alcohol use: No  . Drug use: No   Review of Systems Constitutional: Negative for fever. Cardiovascular: Negative for chest pain. Respiratory: Negative for shortness of breath. Gastrointestinal: Negative for abdominal pain, positive for nausea Musculoskeletal: Negative for back pain. Skin: Negative for rash. Neurological: Positive for headache  All systems negative/normal/unremarkable except  as stated in the HPI  ____________________________________________   PHYSICAL EXAM:  VITAL SIGNS: ED Triage Vitals  Enc Vitals Group     BP 12/29/17 0700 (!) 148/78     Pulse Rate 12/29/17 0700 88     Resp --      Temp 12/29/17 0700 97.8 F (36.6 C)     Temp Source 12/29/17 0700 Oral     SpO2 12/29/17 0700 96 %     Weight 12/29/17 0657 225 lb (102.1 kg)     Height --      Head Circumference --      Peak Flow --      Pain Score 12/29/17 0657 7     Pain Loc --      Pain Edu? --      Excl. in [place]? --    Constitutional: Alert and oriented. Well appearing and in no distress. Eyes: Conjunctivae are normal. Normal extraocular movements.  No photophobia ENT   Head: Normocephalic and atraumatic.   Nose: No congestion/rhinnorhea.   Mouth/Throat: Mucous membranes are moist.   Neck: No stridor. Cardiovascular: Normal rate, regular rhythm. No murmurs, rubs, or gallops. Respiratory: Normal respiratory effort without tachypnea nor retractions. Breath sounds are clear and equal bilaterally. No wheezes/rales/rhonchi. Musculoskeletal: Nontender with normal range of motion in extremities. No lower extremity tenderness nor edema. Neurologic:  Normal speech and language. No gross focal neurologic deficits are appreciated.  Skin:  Skin is warm, dry and intact. No rash noted. Psychiatric: Mood and affect are normal. Speech and behavior are normal.  ____________________________________________  ED COURSE:  As part of my medical decision making, I reviewed the following data within the [place] History obtained from family if available, nursing notes, old chart and ekg, as well as notes from prior ED visits. Patient presented for migraine headache, we will assess with labs and provide IV headache cocktail   Procedures ____________________________________________   LABS (pertinent positives/negatives)  Labs Reviewed  CBC WITH DIFFERENTIAL/PLATELET - Abnormal;  Notable for the following components:      Result Value   Hemoglobin 11.6 (*)    All other components within normal limits  COMPREHENSIVE METABOLIC PANEL - Abnormal; Notable for the following components:   Glucose, Bld 105 (*)    Albumin 3.2 (*)    All other components within normal limits   ____________________________________________  DIFFERENTIAL DIAGNOSIS   Migraine, tension headache, anxiety, depression  FINAL ASSESSMENT AND PLAN  Migraine headache   Plan: The patient had presented for migraine headache. Patient's labs reveal any acute process.  Unfortunately because she drives an hour away I was limited in the medications I can give her.  She was also allergic to narcotics and NSAIDs.  I did restart her Topamax which she has been off of.  At one point she was taking 400 mg twice a day.  She will be discharged with Phenergan and Topamax prescriptions.  She is cleared for outpatient follow-up.   Laurence Aly, MD   Note: This note was generated in part or whole with voice recognition software. Voice recognition is usually quite accurate but there are transcription errors that can and very often do occur. I apologize for any typographical errors that were not detected and corrected.     Earleen Newport, MD 12/29/17 1007

## 2018-12-12 ENCOUNTER — Other Ambulatory Visit (HOSPITAL_COMMUNITY)
Admission: RE | Admit: 2018-12-12 | Discharge: 2018-12-12 | Disposition: A | Discharge status: Home / Self Care | Payer: Medicaid Other | Source: Ambulatory Visit | Attending: Advanced Practice Midwife | Admitting: Advanced Practice Midwife

## 2018-12-12 ENCOUNTER — Other Ambulatory Visit: Payer: Self-pay

## 2018-12-12 ENCOUNTER — Ambulatory Visit (INDEPENDENT_AMBULATORY_CARE_PROVIDER_SITE_OTHER): Payer: Medicaid Other | Admitting: Advanced Practice Midwife

## 2018-12-12 ENCOUNTER — Encounter: Payer: Self-pay | Admitting: Advanced Practice Midwife

## 2018-12-12 VITALS — BP 132/90 | HR 82 | Ht 63.0 in | Wt 243.0 lb

## 2018-12-12 DIAGNOSIS — Z124 Encounter for screening for malignant neoplasm of cervix: Secondary | ICD-10-CM

## 2018-12-12 DIAGNOSIS — Z0000 Encounter for general adult medical examination without abnormal findings: Secondary | ICD-10-CM | POA: Diagnosis not present

## 2018-12-12 DIAGNOSIS — Z3041 Encounter for surveillance of contraceptive pills: Secondary | ICD-10-CM

## 2018-12-12 DIAGNOSIS — Z01419 Encounter for gynecological examination (general) (routine) without abnormal findings: Secondary | ICD-10-CM | POA: Insufficient documentation

## 2018-12-12 MED ORDER — SPRINTEC 28 0.25-35 MG-MCG PO TABS: 1.0000 | ORAL_TABLET | Freq: Every day | ORAL | 4 refills | Status: AC

## 2018-12-12 NOTE — Patient Instructions (Signed)
Health Maintenance, Female Adopting a healthy lifestyle and getting preventive care are important in promoting health and wellness. Ask your health care provider about:  The right schedule for you to have regular tests and exams.  Things you can do on your own to prevent diseases and keep yourself healthy. What should I know about diet, weight, and exercise? Eat a healthy diet   Eat a diet that includes plenty of vegetables, fruits, low-fat dairy products, and lean protein.  Do not eat a lot of foods that are high in solid fats, added sugars, or sodium. Maintain a healthy weight Body mass index (BMI) is used to identify weight problems. It estimates body fat based on height and weight. Your health care provider can help determine your BMI and help you achieve or maintain a healthy weight. Get regular exercise Get regular exercise. This is one of the most important things you can do for your health. Most adults should:  Exercise for at least 150 minutes each week. The exercise should increase your heart rate and make you sweat (moderate-intensity exercise).  Do strengthening exercises at least twice a week. This is in addition to the moderate-intensity exercise.  Spend less time sitting. Even light physical activity can be beneficial. Watch cholesterol and blood lipids Have your blood tested for lipids and cholesterol at 39 years of age, then have this test every 5 years. Have your cholesterol levels checked more often if:  Your lipid or cholesterol levels are high.  You are older than 40 years of age.  You are at high risk for heart disease. What should I know about cancer screening? Depending on your health history and family history, you may need to have cancer screening at various ages. This may include screening for:  Breast cancer.  Cervical cancer.  Colorectal cancer.  Skin cancer.  Lung cancer. What should I know about heart disease, diabetes, and high blood  pressure? Blood pressure and heart disease  High blood pressure causes heart disease and increases the risk of stroke. This is more likely to develop in people who have high blood pressure readings, are of African descent, or are overweight.  Have your blood pressure checked: ? Every 3-5 years if you are 18-39 years of age. ? Every year if you are 40 years old or older. Diabetes Have regular diabetes screenings. This checks your fasting blood sugar level. Have the screening done:  Once every three years after age 40 if you are at a normal weight and have a low risk for diabetes.  More often and at a younger age if you are overweight or have a high risk for diabetes. What should I know about preventing infection? Hepatitis B If you have a higher risk for hepatitis B, you should be screened for this virus. Talk with your health care provider to find out if you are at risk for hepatitis B infection. Hepatitis C Testing is recommended for:  Everyone born from 1945 through 1965.  Anyone with known risk factors for hepatitis C. Sexually transmitted infections (STIs)  Get screened for STIs, including gonorrhea and chlamydia, if: ? You are sexually active and are younger than 39 years of age. ? You are older than 39 years of age and your health care provider tells you that you are at risk for this type of infection. ? Your sexual activity has changed since you were last screened, and you are at increased risk for chlamydia or gonorrhea. Ask your health care provider if   you are at risk.  Ask your health care provider about whether you are at high risk for HIV. Your health care provider may recommend a prescription medicine to help prevent HIV infection. If you choose to take medicine to prevent HIV, you should first get tested for HIV. You should then be tested every 3 months for as long as you are taking the medicine. Pregnancy  If you are about to stop having your period (premenopausal) and  you may become pregnant, seek counseling before you get pregnant.  Take 400 to 800 micrograms (mcg) of folic acid every day if you become pregnant.  Ask for birth control (contraception) if you want to prevent pregnancy. Osteoporosis and menopause Osteoporosis is a disease in which the bones lose minerals and strength with aging. This can result in bone fractures. If you are 65 years old or older, or if you are at risk for osteoporosis and fractures, ask your health care provider if you should:  Be screened for bone loss.  Take a calcium or vitamin D supplement to lower your risk of fractures.  Be given hormone replacement therapy (HRT) to treat symptoms of menopause. Follow these instructions at home: Lifestyle  Do not use any products that contain nicotine or tobacco, such as cigarettes, e-cigarettes, and chewing tobacco. If you need help quitting, ask your health care provider.  Do not use street drugs.  Do not share needles.  Ask your health care provider for help if you need support or information about quitting drugs. Alcohol use  Do not drink alcohol if: ? Your health care provider tells you not to drink. ? You are pregnant, may be pregnant, or are planning to become pregnant.  If you drink alcohol: ? Limit how much you use to 0-1 drink a day. ? Limit intake if you are breastfeeding.  Be aware of how much alcohol is in your drink. In the [place] one drink equals one 12 oz bottle of beer (355 mL), one 5 oz glass of wine (148 mL), or one 1 oz glass of hard liquor (44 mL). General instructions  Schedule regular health, dental, and eye exams.  Stay current with your vaccines.  Tell your health care provider if: ? You often feel depressed. ? You have ever been abused or do not feel safe at home. Summary  Adopting a healthy lifestyle and getting preventive care are important in promoting health and wellness.  Follow your health care provider's instructions about healthy  diet, exercising, and getting tested or screened for diseases.  Follow your health care provider's instructions on monitoring your cholesterol and blood pressure. This information is not intended to replace advice given to you by your health care provider. Make sure you discuss any questions you have with your health care provider. Document Released: 09/21/2010 Document Revised: 03/01/2018 Document Reviewed: 03/01/2018 Elsevier Patient Education  2020 Elsevier Inc.  

## 2018-12-13 ENCOUNTER — Encounter: Payer: Self-pay | Admitting: Advanced Practice Midwife

## 2018-12-13 NOTE — Progress Notes (Signed)
Gynecology Annual Exam   Date of Service: 12/12/2018  PCP: System, Provider Not In  Chief Complaint:  Chief Complaint  Patient presents with   Gynecologic Exam    breast tenderness    History of Present Illness: Patient is a 38 y.o. [id number] presents for annual exam. The patient has no gyn complaints today. She does admit general breast tenderness. She denies any other breast concerns.   LMP: Patient's last menstrual period was 11/08/2018. Average Interval: regular, 28 days Duration of flow: 2 days Heavy Menses: no Clots: no Intermenstrual Bleeding: no Postcoital Bleeding: no Dysmenorrhea: no  The patient is sexually active with a new partner since last annual exam. She currently uses OCP (estrogen/progesterone) for contraception. She denies dyspareunia.  The patient does perform self breast exams.  There is notable family history of breast or ovarian cancer in her family. See family history. She does request MyRisk testing today given significant family history.   The patient wears seatbelts: yes.   The patient has regular exercise: She is active doing yardwork and gardening. She admits primarily healthy diet. She does not drink much water. Her usual drinks are coffee, juice and soda.    She is still smoking- about 15 cigarettes per day and is not ready to quit. She has tried unsuccessfully before. She does verbalize understanding of the risks associated with cigarette use and combined oral contraceptives but wants to continue with her current OCP because it is working well.   The patient denies current symptoms of depression.    Review of Systems: Review of Systems  Constitutional: Negative.   HENT: Negative.   Eyes: Negative.   Respiratory: Negative.   Cardiovascular: Negative.   Gastrointestinal: Negative.   Genitourinary: Negative.   Musculoskeletal: Negative.   Skin: Negative.   Neurological: Negative.   Endo/Heme/Allergies: Negative.   Psychiatric/Behavioral:  Negative.     Past Medical History:  Past Medical History:  Diagnosis Date   Anxiety    Asthma    Bipolar 1 disorder ([place])    Depressive disorder    FH: BRCA gene positive    pat aunt; pt declined genetic testing 01/2017    Past Surgical History:  Past Surgical History:  Procedure Laterality Date   ADENOIDECTOMY     CHOLECYSTECTOMY     HERNIA REPAIR     IUD REMOVAL     TONSILLECTOMY      Gynecologic History:  Patient's last menstrual period was 11/08/2018. Contraception: OCP (estrogen/progesterone) Last Pap: 2 years Results were: no abnormalities   Obstetric History: [id number]  Family History:  Family History  Problem Relation Age of Onset   Breast cancer Maternal Grandmother 60       x3   Breast cancer Paternal Aunt 54   BRCA 1/2 Paternal Aunt    Ovarian cancer Paternal Grandmother    Uterine cancer Paternal Grandmother    Colon cancer Paternal Grandmother 78   Pancreatic cancer Paternal Aunt    Breast cancer Cousin 73    Social History:  Social History   Socioeconomic History   Marital status: Married    Spouse name: Not on file   Number of children: Not on file   Years of education: Not on file   Highest education level: Not on file  Occupational History   Not on file  Social Needs   Financial resource strain: Not on file   Food insecurity    Worry: Not on file    Inability: Not on file  Transportation needs    Medical: Not on file    Non-medical: Not on file  Tobacco Use   Smoking status: Current Every Day Smoker   Smokeless tobacco: Never Used  Substance and Sexual Activity   Alcohol use: No   Drug use: No   Sexual activity: Yes    Birth control/protection: Pill  Lifestyle   Physical activity    Days per week: Not on file    Minutes per session: Not on file   Stress: Not on file  Relationships   Social connections    Talks on phone: Not on file    Gets together: Not on file    Attends religious  service: Not on file    Active member of club or organization: Not on file    Attends meetings of clubs or organizations: Not on file    Relationship status: Not on file   Intimate partner violence    Fear of current or ex partner: Not on file    Emotionally abused: Not on file    Physically abused: Not on file    Forced sexual activity: Not on file  Other Topics Concern   Not on file  Social History Narrative   Not on file    Allergies:  Allergies  Allergen Reactions   Morphine Anaphylaxis   Aspirin     Unknown   Lodine [Etodolac]     Unknown   Toradol [Ketorolac Tromethamine]     Unknown    Medications: Prior to Admission medications   Medication Sig Start Date End Date Taking? Authorizing Provider  albuterol (PROVENTIL HFA;VENTOLIN HFA) 108 (90 BASE) MCG/ACT inhaler Inhale 2 puffs into the lungs every 6 (six) hours as needed. For shortness of breath   Yes [provider]  loratadine (CLARITIN) 10 MG tablet Take by mouth. 02/06/18 02/06/19 Yes [provider]  promethazine (PHENERGAN) 25 MG tablet Take 1 tablet (25 mg total) by mouth every 4 (four) hours as needed for nausea or vomiting. 12/29/17  Yes Earleen Newport, MD  SPRINTEC 28 0.25-35 MG-MCG tablet Take 1 tablet by mouth daily. 12/12/18  Yes Rod Can, CNM  topiramate (TOPAMAX) 25 MG tablet Take 1 tablet (25 mg total) by mouth at bedtime. 12/29/17 12/29/18 Yes Earleen Newport, MD    Physical Exam Vitals: Blood pressure 132/90, pulse 82, height _0  (1.6 m), weight 243 lb (110.2 kg), last menstrual period 11/08/2018.  General: NAD HEENT: normocephalic, anicteric Thyroid: no enlargement, no palpable nodules Pulmonary: No increased work of breathing, CTAB Cardiovascular: RRR, distal pulses 2+ Breast: Breast symmetrical, mild tenderness to palpation, no palpable nodules or masses, no skin or nipple retraction present, no nipple discharge.  No axillary or supraclavicular  lymphadenopathy. Abdomen: NABS, soft, non-tender, non-distended.  Umbilicus without lesions.  No hepatomegaly, splenomegaly or masses palpable. No evidence of hernia  Genitourinary:  External: Normal external female genitalia.  Normal urethral meatus, normal Bartholin's and Skene's glands.    Vagina: Normal vaginal mucosa, no evidence of prolapse.    Cervix: Grossly normal in appearance, no bleeding, no CMT  Uterus: Non-enlarged, mobile, normal contour.    Adnexa: ovaries non-enlarged, no adnexal masses  Rectal: deferred  Lymphatic: no evidence of inguinal lymphadenopathy Extremities: no edema, erythema, or tenderness Neurologic: Grossly intact Psychiatric: mood appropriate, affect full  Female chaperone present for pelvic and breast  portions of the physical exam   Assessment: 38 y.o. [id number] routine annual exam  Plan: Problem List Items Addressed This Visit  None    Visit Diagnoses    Well woman exam with routine gynecological exam    -  Primary   Relevant Orders   Cytology - PAP   Cervical cancer screening       Relevant Orders   Cytology - PAP   Encounter for surveillance of contraceptive pills       Relevant Medications   SPRINTEC 28 0.25-35 MG-MCG tablet      1) STI screening  was offered and declined. She and new partner were both tested at beginning of relationship and were negative.  2)  ASCCP guidelines and rationale discussed.  Patient opts for today due to new partner and every 3 years screening interval  3) Contraception - the patient is currently using  OCP (estrogen/progesterone).  She is happy with her current form of contraception and plans to continue  4) Routine healthcare maintenance including cholesterol, diabetes screening discussed: Declines   5) Smoking cessation encouraged  6) Return in about 1 year (around 12/12/2019) for annual established gyn.   Rod Can, [facility] 12/13/2018, 2:24 PM

## 2018-12-14 ENCOUNTER — Telehealth: Payer: Self-pay

## 2018-12-14 LAB — CYTOLOGY - PAP
Diagnosis: NEGATIVE
High risk HPV: NEGATIVE
Molecular Disclaimer: 56
Molecular Disclaimer: DETECTED
Molecular Disclaimer: NORMAL

## 2018-12-14 NOTE — Telephone Encounter (Signed)
Pt called back, I have explained the financial assistance form she needs to fill out and sign. Form has been [employer], [employer]. Will await to receive form and then ship specimen.

## 2018-12-14 NOTE — Telephone Encounter (Signed)
Pt had Myriad drawn on Tuesday 9/22. She left without signing Medicaid form. I called this morning and LVM for her to call back.  Pt needs to come to office to sign form before we can send specimen. If I am not available please let Willette Pa know she is on the phone. Willette Pa is aware of situation.

## 2018-12-21 DIAGNOSIS — Z1371 Encounter for nonprocreative screening for genetic disease carrier status: Secondary | ICD-10-CM

## 2018-12-21 HISTORY — DX: Encounter for nonprocreative screening for genetic disease carrier status: Z13.71

## 2019-01-02 ENCOUNTER — Encounter: Payer: Self-pay | Admitting: Obstetrics and Gynecology

## 2019-01-24 ENCOUNTER — Emergency Department (HOSPITAL_COMMUNITY)
Admission: EM | Admit: 2019-01-24 | Discharge: 2019-01-24 | Disposition: A | Discharge status: Home / Self Care | Payer: Medicaid Other | Attending: Emergency Medicine | Admitting: Emergency Medicine

## 2019-01-24 ENCOUNTER — Emergency Department (HOSPITAL_COMMUNITY): Payer: Medicaid Other

## 2019-01-24 ENCOUNTER — Encounter (HOSPITAL_COMMUNITY): Payer: Self-pay | Admitting: *Deleted

## 2019-01-24 ENCOUNTER — Other Ambulatory Visit: Payer: Self-pay

## 2019-01-24 DIAGNOSIS — F17210 Nicotine dependence, cigarettes, uncomplicated: Secondary | ICD-10-CM | POA: Insufficient documentation

## 2019-01-24 DIAGNOSIS — Y999 Unspecified external cause status: Secondary | ICD-10-CM | POA: Diagnosis not present

## 2019-01-24 DIAGNOSIS — Z79899 Other long term (current) drug therapy: Secondary | ICD-10-CM | POA: Diagnosis not present

## 2019-01-24 DIAGNOSIS — Y92481 Parking lot as the place of occurrence of the external cause: Secondary | ICD-10-CM | POA: Diagnosis not present

## 2019-01-24 DIAGNOSIS — S60222A Contusion of left hand, initial encounter: Secondary | ICD-10-CM

## 2019-01-24 DIAGNOSIS — J45909 Unspecified asthma, uncomplicated: Secondary | ICD-10-CM | POA: Insufficient documentation

## 2019-01-24 DIAGNOSIS — S098XXA Other specified injuries of head, initial encounter: Secondary | ICD-10-CM | POA: Diagnosis not present

## 2019-01-24 DIAGNOSIS — S0011XA Contusion of right eyelid and periocular area, initial encounter: Secondary | ICD-10-CM | POA: Diagnosis not present

## 2019-01-24 DIAGNOSIS — Y939 Activity, unspecified: Secondary | ICD-10-CM | POA: Diagnosis not present

## 2019-01-24 DIAGNOSIS — S6982XA Other specified injuries of left wrist, hand and finger(s), initial encounter: Secondary | ICD-10-CM | POA: Diagnosis present

## 2019-01-24 DIAGNOSIS — H05231 Hemorrhage of right orbit: Secondary | ICD-10-CM

## 2019-01-24 LAB — POC URINE PREG, ED: Preg Test, Ur: NEGATIVE

## 2019-01-24 MED ORDER — HYDROCODONE-ACETAMINOPHEN 5-325 MG PO TABS: 1.0000 | ORAL_TABLET | ORAL | 0 refills | Status: DC | PRN

## 2019-01-24 MED ORDER — TETRACAINE HCL 0.5 % OP SOLN: 1.0000 [drp] | Freq: Once | OPHTHALMIC | Status: DC

## 2019-01-24 MED ORDER — ERYTHROMYCIN 5 MG/GM OP OINT
TOPICAL_OINTMENT | Freq: Once | OPHTHALMIC | Status: AC
  Administered 2019-01-24: 17:00:00 via OPHTHALMIC
  Filled 2019-01-24: qty 3.5

## 2019-01-24 MED ORDER — FLUORESCEIN SODIUM 1 MG OP STRP
1.0000 | ORAL_STRIP | Freq: Once | OPHTHALMIC | Status: AC
  Administered 2019-01-24: 1 via OPHTHALMIC

## 2019-01-24 MED ORDER — IBUPROFEN 800 MG PO TABS
800.0000 mg | ORAL_TABLET | Freq: Once | ORAL | Status: AC
  Administered 2019-01-24: 800 mg via ORAL
  Filled 2019-01-24: qty 1
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
## 2019-01-24 NOTE — Discharge Instructions (Signed)
Ice alternating with heat along with elevation and wrapping in an ace wrap for several days may help with the pain and swelling in your hand.  Your CT images and plain xrays are negative for any internal injury or fractures.  You may take the hydrocodone prescribed for pain relief.  This will make you drowsy - do not drive within 4 hours of taking this medication.  I also recommend applying the ophthalmic antibiotic ointment around your right eye (safe for in the eye as well) twice daily until the skin soreness is improved.  Plan a recheck by your primary MD if you are not feeling better over the next week with this treatment plan.
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
## 2019-01-24 NOTE — ED Notes (Signed)
Patient to CT.

## 2019-04-18 ENCOUNTER — Encounter (HOSPITAL_COMMUNITY): Payer: Self-pay | Admitting: Emergency Medicine

## 2019-04-18 ENCOUNTER — Emergency Department (HOSPITAL_COMMUNITY)
Admission: EM | Admit: 2019-04-18 | Discharge: 2019-04-18 | Disposition: A | Discharge status: Home / Self Care | Payer: Medicaid Other | Attending: Emergency Medicine | Admitting: Emergency Medicine

## 2019-04-18 ENCOUNTER — Other Ambulatory Visit: Payer: Self-pay

## 2019-04-18 ENCOUNTER — Emergency Department (HOSPITAL_COMMUNITY): Payer: Medicaid Other

## 2019-04-18 DIAGNOSIS — R05 Cough: Secondary | ICD-10-CM | POA: Diagnosis present

## 2019-04-18 DIAGNOSIS — Z79899 Other long term (current) drug therapy: Secondary | ICD-10-CM | POA: Insufficient documentation

## 2019-04-18 DIAGNOSIS — F17210 Nicotine dependence, cigarettes, uncomplicated: Secondary | ICD-10-CM | POA: Insufficient documentation

## 2019-04-18 DIAGNOSIS — R059 Cough, unspecified: Secondary | ICD-10-CM

## 2019-04-18 DIAGNOSIS — J45901 Unspecified asthma with (acute) exacerbation: Secondary | ICD-10-CM | POA: Diagnosis not present

## 2019-04-18 DIAGNOSIS — Z20822 Contact with and (suspected) exposure to covid-19: Secondary | ICD-10-CM | POA: Insufficient documentation

## 2019-04-18 LAB — CBC WITH DIFFERENTIAL/PLATELET
Abs Immature Granulocytes: 0.04 10*3/uL (ref 0.00–0.07)
Basophils Absolute: 0.1 10*3/uL (ref 0.0–0.1)
Basophils Relative: 1 %
Eosinophils Absolute: 0.3 10*3/uL (ref 0.0–0.5)
Eosinophils Relative: 2 %
HCT: 40.9 % (ref 36.0–46.0)
Hemoglobin: 13.1 g/dL (ref 12.0–15.0)
Immature Granulocytes: 0 %
Lymphocytes Relative: 26 %
Lymphs Abs: 2.8 10*3/uL (ref 0.7–4.0)
MCH: 28.4 pg (ref 26.0–34.0)
MCHC: 32 g/dL (ref 30.0–36.0)
MCV: 88.5 fL (ref 80.0–100.0)
Monocytes Absolute: 0.4 10*3/uL (ref 0.1–1.0)
Monocytes Relative: 4 %
Neutro Abs: 7.2 10*3/uL (ref 1.7–7.7)
Neutrophils Relative %: 67 %
Platelets: 304 10*3/uL (ref 150–400)
RBC: 4.62 MIL/uL (ref 3.87–5.11)
RDW: 13.2 % (ref 11.5–15.5)
WBC: 10.8 10*3/uL — ABNORMAL HIGH (ref 4.0–10.5)
nRBC: 0 % (ref 0.0–0.2)

## 2019-04-18 LAB — BASIC METABOLIC PANEL
Anion gap: 11 (ref 5–15)
BUN: 10 mg/dL (ref 6–20)
CO2: 23 mmol/L (ref 22–32)
Calcium: 8.9 mg/dL (ref 8.9–10.3)
Chloride: 106 mmol/L (ref 98–111)
Creatinine, Ser: 0.69 mg/dL (ref 0.44–1.00)
GFR calc Af Amer: >60 mL/min (ref >60)
GFR calc non Af Amer: >60 mL/min (ref >60)
Glucose, Bld: 94 mg/dL (ref 70–99)
Potassium: 3.5 mmol/L (ref 3.5–5.1)
Sodium: 140 mmol/L (ref 135–145)

## 2019-04-18 LAB — I-STAT BETA HCG BLOOD, ED (MC, WL, AP ONLY): I-stat hCG, quantitative: <5 m[IU]/mL (ref <5)

## 2019-04-18 LAB — SARS CORONAVIRUS 2 (TAT 6-24 HRS): SARS Coronavirus 2: NEGATIVE

## 2019-04-18 LAB — POC SARS CORONAVIRUS 2 AG -  ED: SARS Coronavirus 2 Ag: NEGATIVE

## 2019-04-18 MED ORDER — IPRATROPIUM BROMIDE HFA 17 MCG/ACT IN AERS: 2.0000 | INHALATION_SPRAY | Freq: Once | RESPIRATORY_TRACT | Status: DC

## 2019-04-18 MED ORDER — HYDROCOD POLST-CPM POLST ER 10-8 MG/5ML PO SUER: 5.0000 mL | Freq: Two times a day (BID) | ORAL | 0 refills | Status: AC | PRN

## 2019-04-18 MED ORDER — NEBULIZER/TUBING/MOUTHPIECE KIT: 1.0000 [IU] | PACK | 0 refills | Status: AC | PRN

## 2019-04-18 MED ORDER — IPRATROPIUM-ALBUTEROL 0.5-2.5 (3) MG/3ML IN SOLN: 3.0000 mL | Freq: Four times a day (QID) | RESPIRATORY_TRACT | 1 refills | Status: AC | PRN

## 2019-04-18 MED ORDER — ALBUTEROL SULFATE HFA 108 (90 BASE) MCG/ACT IN AERS
6.0000 | INHALATION_SPRAY | Freq: Once | RESPIRATORY_TRACT | Status: DC
  Administered 2019-04-18: 14:00:00 6 via RESPIRATORY_TRACT
  Filled 2019-04-18: qty 6.7

## 2019-04-18 MED ORDER — PREDNISONE 10 MG PO TABS: 20.0000 mg | ORAL_TABLET | Freq: Every day | ORAL | 0 refills | Status: AC

## 2019-04-18 MED ORDER — IPRATROPIUM-ALBUTEROL 20-100 MCG/ACT IN AERS: 2.0000 | INHALATION_SPRAY | Freq: Four times a day (QID) | RESPIRATORY_TRACT | Status: DC

## 2019-04-18 MED ORDER — HYDROCODONE-CHLORPHENIRAMINE 5-4 MG/5ML PO SOLN: 5.0000 mL | Freq: Four times a day (QID) | ORAL | 0 refills | Status: DC | PRN

## 2019-04-18 MED ORDER — PREDNISONE 50 MG PO TABS
60.0000 mg | ORAL_TABLET | Freq: Once | ORAL | Status: AC
  Administered 2019-04-18: 60 mg via ORAL
  Filled 2019-04-18: qty 1

## 2019-04-18 NOTE — Discharge Instructions (Addendum)
You were seen in the ED for asthma exacerbation and possible virus.  I suspect you have a virus. We tested your for COVID-19 (coronavirus) infection.  It is also possible you could have other viral upper respiratory infection from another virus.    -Prescription has been sent to your pharmacy for DuoNeb.  This is a nebulizer solution that has both ipratropium and albuterol.  Do not use your albuterol inhaler while using the nebulizer because you do not want to have too much. -Prescription also sent for prednisone.  Please take as prescribed -Tussionex cough medicine also sent to the pharmacy.  Take this every 6 hours if needed.  Do not drive or work while taking this because it can make you drowsy.  Test results come back in 48 hours, sometimes sooner.  Someone will call you if ypu are positive for COVID. If the result is negative you can see it on your MyChart.  Treatment of your illness and symptoms will include self-isolation, monitoring of symptoms and supportive care with over-the-counter medicines.    Return to the ED if there is increased work of breathing, shortness of breath, inability to tolerate fluids, weakness, chest pain.  If your test results are POSITIVE, the following isolation requirements need to be met to return to work and resume essential activities: At least 10 days since symptom onset  72 hours of absence of fever without antifever medicine (ibuprofen, acetaminophen). A fever is temperature of 100.36F or greater. Improvement of respiratory symptoms  If your test is NEGATIVE, you may return to work and essential activities as long as your symptoms have improved and you do not have a fever for a total of 3 days.  Call your job and notify them that your test result was negative to see if they will allow you to return to work.   Stay well-hydrated. Rest. You can use over the counter medications to help with symptoms: 600 mg ibuprofen (motrin, aleve, advil) or acetaminophen  (tylenol) every 6 hours, around the clock to help with associated fevers, sore throat, headaches, generalized body aches and malaise.  Oxymetazoline (afrin) intranasal spray once daily for no more than 3 days to help with congestion, after 3 days you can switch to another over-the-counter nasal steroid spray such as fluticasone (flonase) Allergy medication (loratadine, cetirizine, etc) and phenylephrine (sudafed) help with nasal congestion, runny nose and postnasal drip.   Dextromethorphan (Delsym) to suppress dry cough. Frequent coughing is likely causing your chest wall pain Wash your hands often to prevent spread.

## 2019-04-18 NOTE — ED Provider Notes (Signed)
[facility] EMERGENCY DEPARTMENT Provider Note   CSN: [id number] Arrival date & time: 04/18/19  1230     History Chief Complaint  Patient presents with  . Cough  . Sore Throat    Angela Mooney is a 39 y.o. female with past medical history significant for anxiety, asthma, bipolar 1 disorder presenting to emergency department today with chief complaint of cough and sore throat x 8 days.  Patient states her cough is nonproductive.  She has been using her albuterol inhaler more often than usual.  She is out of nebulizer treatments and does not have any refills.  She does admit to posttussive emesis, no hemoptysis.  She feels short of breath when coughing but not at rest or with exertion.   She states her throat feels sore when swallowing. She is also having diarrhea, she estimates 4 episodes in the last 24 hours.  She denies seeing any blood in her stool, no recent antibiotic use or travel.  She has tried taking Tylenol and over-the-counter cough medications without symptom improvement.  She feels like she is getting worse despite attempting of over-the-counter treatments.  She called her PCP office to try to schedule an appointment and was told only a telemedicine visit could be scheduled, patient opted to come to the emergency department instead for further evaluation.  She denies any fever, chills, chest pain, abdominal pain, gross hematuria, urinary frequency, dysuria, lower extremity edema.  Denies any sick contacts or contact with anyone positive for COVID-19. History provided by patient with additional history obtained from chart review.      Past Medical History:  Diagnosis Date  . Anxiety   . Asthma   . Bipolar 1 disorder ([place])   . BRCA negative 12/2018   MyRisk neg  . Depressive disorder   . Family history of breast cancer   . FH: BRCA gene positive    pat aunt; pt declined genetic testing 01/2017    Patient Active Problem List   Diagnosis Date Noted  . Bipolar  disorder ([place]) 02/17/2017  . MDD (major depressive disorder) 02/17/2017  . Migraines 02/17/2017  . Seasonal allergies 02/17/2017  . Tobacco use 02/17/2017    Past Surgical History:  Procedure Laterality Date  . ADENOIDECTOMY    . CHOLECYSTECTOMY    . HERNIA REPAIR    . IUD REMOVAL    . TONSILLECTOMY       OB History    Gravida  7   Para  5   Term  5   Preterm      AB  2   Living  5     SAB      TAB      Ectopic      Multiple      Live Births  5           Family History  Problem Relation Age of Onset  . Breast cancer Maternal Grandmother 60       x3  . Breast cancer Paternal Aunt 42  . BRCA 1/2 Paternal Aunt   . Ovarian cancer Paternal Grandmother   . Uterine cancer Paternal Grandmother   . Colon cancer Paternal Grandmother 9  . Pancreatic cancer Paternal Aunt   . Breast cancer Cousin 94    Social History   Tobacco Use  . Smoking status: Current Every Day Smoker    Packs/day: 0.50    Types: Cigarettes  . Smokeless tobacco: Never Used  Substance Use Topics  . Alcohol  use: No  . Drug use: No    Home Medications Prior to Admission medications   Medication Sig Start Date End Date Taking? Authorizing Provider  albuterol (PROVENTIL HFA;VENTOLIN HFA) 108 (90 BASE) MCG/ACT inhaler Inhale 2 puffs into the lungs every 6 (six) hours as needed. For shortness of breath   Yes [provider]  [place] 28 0.25-35 MG-MCG tablet Take 1 tablet by mouth daily. 12/12/18  Yes Rod Can, CNM  chlorpheniramine-HYDROcodone (TUSSIONEX PENNKINETIC ER) 10-8 MG/5ML SUER Take 5 mLs by mouth every 12 (twelve) hours as needed for up to 5 days for cough. 04/18/19 04/23/19  Elexus Barman E, PA-C  ipratropium-albuterol (DUONEB) 0.5-2.5 (3) MG/3ML SOLN Take 3 mLs by nebulization every 6 (six) hours as needed. 04/18/19   Lilburn Straw E, PA-C  predniSONE (DELTASONE) 10 MG tablet Take 2 tablets (20 mg total) by mouth daily for 5 days. 04/18/19 04/23/19  Alzena Gerber,  Harley Hallmark, PA-C  Respiratory Therapy Supplies (NEBULIZER/TUBING/MOUTHPIECE) KIT 1 Units by Does not apply route as needed. 04/18/19   Floyd Wade E, PA-C  topiramate (TOPAMAX) 25 MG tablet Take 1 tablet (25 mg total) by mouth at bedtime. 12/29/17 12/29/18  Earleen Newport, MD  promethazine (PHENERGAN) 25 MG tablet Take 1 tablet (25 mg total) by mouth every 4 (four) hours as needed for nausea or vomiting. Patient not taking: Reported on 04/18/2019 12/29/17 04/18/19  Earleen Newport, MD    Allergies    Morphine, Aspirin, Lodine [etodolac], and Toradol [ketorolac tromethamine]  Review of Systems   Review of Systems All other systems are reviewed and are negative for acute change except as noted in the HPI.  Physical Exam Updated Vital Signs BP (!) 178/82 (BP Location: Right Arm)   Pulse (!) 109   Temp 99.3 F (37.4 C) (Oral)   Resp 15   Ht _0  (1.549 m)   Wt 111.1 kg   SpO2 98%   BMI 46.29 kg/m   Physical Exam Vitals and nursing note reviewed.  Constitutional:      General: She is not in acute distress.    Appearance: She is not ill-appearing.  HENT:     Head: Normocephalic and atraumatic.     Comments: No sinus or temporal tenderness.    Right Ear: Tympanic membrane and external ear normal.     Left Ear: Tympanic membrane and external ear normal.     Nose: Nose normal.     Mouth/Throat:     Mouth: Mucous membranes are moist.     Pharynx: Oropharynx is clear.     Comments: Minor erythema to oropharynx, no edema, no exudate, tonsils are surgically absent, voice normal, neck supple without lymphadenopathy  Eyes:     General: No scleral icterus.       Right eye: No discharge.        Left eye: No discharge.     Extraocular Movements: Extraocular movements intact.     Conjunctiva/sclera: Conjunctivae normal.     Pupils: Pupils are equal, round, and reactive to light.  Neck:     Vascular: No JVD.  Cardiovascular:     Rate and Rhythm: Regular rhythm.  Tachycardia present.     Pulses: Normal pulses.          Radial pulses are 2+ on the right side and 2+ on the left side.     Heart sounds: Normal heart sounds.  Pulmonary:     Comments: Patient has dry hacking cough during exam.  Faint expiratory  wheeze heard. symmetric chest rise. No  rales, or rhonchi.  No respiratory distress.  SPO2 is 97% during exam on room air. Abdominal:     Tenderness: There is no right CVA tenderness or left CVA tenderness.     Comments: Abdomen is soft, non-distended, and non-tender in all quadrants. No rigidity, no guarding. No peritoneal signs.  Musculoskeletal:        General: Normal range of motion.     Cervical back: Normal range of motion.     Comments: Homans sign absent bilaterally, no lower extremity edema, no palpable cords, compartments are soft  Skin:    General: Skin is warm and dry.     Capillary Refill: Capillary refill takes less than 2 seconds.     Findings: No rash.  Neurological:     Mental Status: She is oriented to person, place, and time.     GCS: GCS eye subscore is 4. GCS verbal subscore is 5. GCS motor subscore is 6.     Comments: Fluent speech, no facial droop.  Psychiatric:        Behavior: Behavior normal.       ED Results / Procedures / Treatments   Labs (all labs ordered are listed, but only abnormal results are displayed) Labs Reviewed  CBC WITH DIFFERENTIAL/PLATELET - Abnormal; Notable for the following components:      Result Value   WBC 10.8 (*)    All other components within normal limits  SARS CORONAVIRUS 2 (TAT 6-24 HRS)  BASIC METABOLIC PANEL  POC SARS CORONAVIRUS 2 AG -  ED  I-STAT BETA HCG BLOOD, ED ([facility], [facility], [facility] ONLY)    EKG None  Radiology DG Chest Port 1 View  Result Date: 04/18/2019 CLINICAL DATA:  Cough, fever, and body aches. EXAM: PORTABLE CHEST 1 VIEW COMPARISON:  Chest x-ray dated February 19, 2011. FINDINGS: The heart size and mediastinal contours are within normal limits. Both lungs are clear.  The visualized skeletal structures are unremarkable. IMPRESSION: No active disease. Electronically Signed   By: Titus Dubin M.D.   On: 04/18/2019 13:37    Procedures Procedures (including critical care time)  Medications Ordered in ED Medications  predniSONE (DELTASONE) tablet 60 mg (60 mg Oral Given 04/18/19 1415)    ED Course  I have reviewed the triage vital signs and the nursing notes.  Pertinent labs & imaging results that were available during my care of the patient were reviewed by me and considered in my medical decision making (see chart for details).    Clinical Course as of Apr 17 1834  Wed Apr 18, 2019  1415 Patient ambulated without hypoxia.  Sats remain 97-100% on RA.    [KA]    Clinical Course User Index [KA] Cherre Robins, PA-C   Vitals:   04/18/19 1308 04/18/19 1309 04/18/19 1621  BP: (!) 178/82  (!) 148/84  Pulse: (!) 109  98  Resp: 15    Temp: 99.3 F (37.4 C)    TempSrc: Oral    SpO2: 98%  92%  Weight:  111.1 kg   Height:  _0  (1.549 m)       MDM Rules/Calculators/A&P                      Patient seen and examined. Patient presents awake, alert, hemodynamically stable, afebrile, non toxic.  She has dry hacking cough during exam.  Faint wheezing heard in all lung fields. During exam she was slightly tachycardic  102-104, no hypoxia. She is not in respiratory distress. She does not appear dehydrated, mucus membranes are moist. Negative Homan's sign. She has shortness of breath when coughing, but not at rest or with exertion, feel that PE and DVT are very unlikely.   CBC with very mild leukocytosis of 10.8, no anemia.  BMP with no severe electrolyte derangement, no renal insufficiency, normal anion gap.  Pregnancy test is negative.  Covid antigen test is negative.  Discussed with patient this could be false negative and will confirm with PCR test.  Patient given prednisone for possible asthma exacerbation and 6 puffs of albuterol inhaler.  On  reassessment wheezing has improved and she reports feeling better.  Patient ambulated in the emergency department without respiratory distress, tachycardia, or hypoxia. SpO2 during ambulation >94% on room air.  Tachycardia has resolved.  Given patient's reassuring vitals, exam and labs today feel that she be discharged home with symptomatic care to cover for asthma exacerbation.  Patient aware she will need to self quarantine until she has the Covid test result. I have reviewed the PDMP during this encounter. Will prescribe tussionex for cough as she has not had it recently. She has taken it in the past with symptom improvement.  The patient appears reasonably screened and/or stabilized for discharge and I doubt any other medical condition or other [place] requiring further screening, evaluation, or treatment in the ED at this time prior to discharge. The patient is safe for discharge with strict return precautions discussed. Recommend pcp follow up if symptoms persist.  Angela Mooney was evaluated in Emergency Department on 04/18/2019 for the symptoms described in the history of present illness. She was evaluated in the context of the global COVID-19 pandemic, which necessitated consideration that the patient might be at risk for infection with the SARS-CoV-2 virus that causes COVID-19. Institutional protocols and algorithms that pertain to the evaluation of patients at risk for COVID-19 are in a state of rapid change based on information released by regulatory bodies including the CDC and federal and state organizations. These policies and algorithms were followed during the patient's care in the ED.  5:40 PM Upon further chart review I realize I prescribed hydrocodone-chlorpheniramine 5-4 mg/52m solution in error.  [facility]ex instead.   Portions of this note were generated with DLobbyist Dictation  errors may occur despite best attempts at proofreading.   Final Clinical Impression(s) / ED Diagnoses Final diagnoses:  Moderate asthma with acute exacerbation, unspecified whether persistent    Rx / DC Orders ED Discharge Orders         Ordered    ipratropium-albuterol (DUONEB) 0.5-2.5 (3) MG/3ML SOLN  Every 6 hours PRN     04/18/19 1603    predniSONE (DELTASONE) 10 MG tablet  Daily     04/18/19 1603    HYDROcodone-Chlorpheniramine 5-4 MG/5ML SOLN  Every 6 hours PRN,   Status:  Discontinued     04/18/19 1603    Respiratory Therapy Supplies (NEBULIZER/TUBING/MOUTHPIECE) KIT  As needed     04/18/19 1606    chlorpheniramine-HYDROcodone (TUSSIONEX PENNKINETIC ER) 10-8 MG/5ML SUER  Every 12 hours PRN     04/18/19 1747           ACherre Robins PA-C 04/18/19 1846    WDaleen Bo MD 04/19/19 1406

## 2019-04-18 NOTE — ED Triage Notes (Signed)
Pt c/o of cough, sore throat, fever, body aches, n/v/d and sinus burning.

## 2019-04-18 NOTE — ED Notes (Signed)
Pt ambulatory in room. Sats remain 97-100% on RA.
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
